# Patient Record
Sex: Male | Born: 1995 | Hispanic: No | Marital: Single | Smoking: Never smoker
Health system: Southern US, Community
[De-identification: ages and names within clinical notes are randomized; demographics above are authoritative.]

## PROBLEM LIST (undated history)

## (undated) HISTORY — PX: OTHER SURGICAL HISTORY: SHX169

## (undated) HISTORY — PX: CIRCUMCISION: SUR203

## (undated) HISTORY — PX: MANDIBLE FRACTURE SURGERY: SHX706

---
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
## 2019-03-22 ENCOUNTER — Emergency Department (HOSPITAL_BASED_OUTPATIENT_CLINIC_OR_DEPARTMENT_OTHER): Payer: No Typology Code available for payment source

## 2019-03-22 ENCOUNTER — Emergency Department (HOSPITAL_BASED_OUTPATIENT_CLINIC_OR_DEPARTMENT_OTHER)
Admission: EM | Admit: 2019-03-22 | Discharge: 2019-03-22 | Disposition: A | Discharge status: Home / Self Care | Payer: No Typology Code available for payment source | Attending: Emergency Medicine | Admitting: Emergency Medicine

## 2019-03-22 ENCOUNTER — Encounter (HOSPITAL_BASED_OUTPATIENT_CLINIC_OR_DEPARTMENT_OTHER): Payer: Self-pay | Admitting: Emergency Medicine

## 2019-03-22 ENCOUNTER — Other Ambulatory Visit: Payer: Self-pay

## 2019-03-22 DIAGNOSIS — S40811A Abrasion of right upper arm, initial encounter: Secondary | ICD-10-CM | POA: Diagnosis not present

## 2019-03-22 DIAGNOSIS — Y9372 Activity, wrestling: Secondary | ICD-10-CM | POA: Diagnosis not present

## 2019-03-22 DIAGNOSIS — Y999 Unspecified external cause status: Secondary | ICD-10-CM | POA: Diagnosis not present

## 2019-03-22 DIAGNOSIS — S20319A Abrasion of unspecified front wall of thorax, initial encounter: Secondary | ICD-10-CM | POA: Insufficient documentation

## 2019-03-22 DIAGNOSIS — Y92009 Unspecified place in unspecified non-institutional (private) residence as the place of occurrence of the external cause: Secondary | ICD-10-CM | POA: Diagnosis not present

## 2019-03-22 DIAGNOSIS — S43014A Anterior dislocation of right humerus, initial encounter: Secondary | ICD-10-CM | POA: Insufficient documentation

## 2019-03-22 DIAGNOSIS — W010XXA Fall on same level from slipping, tripping and stumbling without subsequent striking against object, initial encounter: Secondary | ICD-10-CM | POA: Insufficient documentation

## 2019-03-22 DIAGNOSIS — S0081XA Abrasion of other part of head, initial encounter: Secondary | ICD-10-CM | POA: Insufficient documentation

## 2019-03-22 DIAGNOSIS — S4991XA Unspecified injury of right shoulder and upper arm, initial encounter: Secondary | ICD-10-CM | POA: Diagnosis present

## 2019-03-22 DIAGNOSIS — Z23 Encounter for immunization: Secondary | ICD-10-CM | POA: Diagnosis not present

## 2019-03-22 MED ORDER — METHOCARBAMOL 500 MG PO TABS: 500.0000 mg | ORAL_TABLET | Freq: Once | ORAL | Status: DC

## 2019-03-22 MED ORDER — FENTANYL CITRATE (PF) 100 MCG/2ML IJ SOLN
50.0000 ug | Freq: Once | INTRAMUSCULAR | Status: AC
  Administered 2019-03-22: 50 ug via INTRAVENOUS
  Filled 2019-03-22: qty 2

## 2019-03-22 MED ORDER — PROPOFOL 10 MG/ML IV BOLUS
1.0000 mg/kg | INTRAVENOUS | Status: DC | PRN
  Filled 2019-03-22: qty 20

## 2019-03-22 MED ORDER — CLONAZEPAM 0.5 MG PO TBDP
0.5000 mg | ORAL_TABLET | Freq: Once | ORAL | Status: DC
  Filled 2019-03-22: qty 1

## 2019-03-22 MED ORDER — NAPROXEN 250 MG PO TABS: 500.0000 mg | ORAL_TABLET | Freq: Once | ORAL | Status: DC

## 2019-03-22 MED ORDER — TETANUS-DIPHTH-ACELL PERTUSSIS 5-2.5-18.5 LF-MCG/0.5 IM SUSP
0.5000 mL | Freq: Once | INTRAMUSCULAR | Status: AC
  Administered 2019-03-22: 0.5 mL via INTRAMUSCULAR
  Filled 2019-03-22: qty 0.5

## 2019-03-22 MED ORDER — ONDANSETRON 4 MG PO TBDP: 4.0000 mg | ORAL_TABLET | Freq: Once | ORAL | Status: DC

## 2019-03-22 MED ORDER — PROPOFOL 10 MG/ML IV BOLUS
INTRAVENOUS | Status: AC | PRN
  Administered 2019-03-22 (×2): 35.95 mg via INTRAVENOUS

## 2019-03-22 MED ORDER — PROPOFOL 1000 MG/100ML IV EMUL: INTRAVENOUS | Status: AC | PRN

## 2019-03-22 NOTE — Discharge Instructions (Addendum)
You have been diagnosed today with Anterior Right Shoulder Dislocation.  At this time there does not appear to be the presence of an emergent medical condition, however there is always the potential for conditions to change. Please read and follow the below instructions.  Please return to the Emergency Department immediately for any new or worsening symptoms or if your symptoms reoccur. Please be sure to follow up with your Primary Care Provider within one week regarding your visit today; please call their office to schedule an appointment even if you are feeling better for a follow-up visit. Please continue using the sling provided today to protect your shoulder from reinjury.  Drink plenty of water and get plenty of rest.  You may use over-the-counter anti-inflammatory such as Tylenol and ibuprofen to help with your pain.  You may use rest, ice to help with pain as well.  Get help right away if: Your pain gets worse, not better. You lose feeling in your arm or hand. Your arm or hand turns white and cold. You have any new/concerning or worsening of symptoms  Please read the additional information packets attached to your discharge summary.  Do not take your medicine if  develop an itchy rash, swelling in your mouth or lips, or difficulty breathing; call 911 and seek immediate emergency medical attention if this occurs.  Note: Portions of this text may have been transcribed using voice recognition software. Every effort was made to ensure accuracy; however, inadvertent computerized transcription errors may still be present.

## 2019-03-22 NOTE — ED Provider Notes (Signed)
[facility] EMERGENCY DEPARTMENT Provider Note   CSN: [id number] Arrival date & time: 03/22/19  1007     History Chief Complaint  Patient presents with  . Shoulder Injury    Adam Patrick is a 24 y.o. male otherwise healthy no daily medication use.  Patient reports that he was wrestling with his brother this morning when he fell onto his right shoulder, he reports immediate onset sharp aching pain nonradiating worsened with any movement or palpation of the right shoulder, minimally improved with staying still.  He reports his shoulder is dislocated.  He denies any other injuries or concerns today, denies head injury, loss of consciousness, blood thinner use, neck pain, back pain, chest pain, abdominal pain or pain to his other 3 extremities. HPI     History reviewed. No pertinent past medical history.  Patient Active Problem List   Diagnosis Date Noted  . Sports physical 03/16/2011    Past Surgical History:  Procedure Laterality Date  . Broken jaw    . CIRCUMCISION    . MANDIBLE FRACTURE SURGERY         Family History  Problem Relation Age of Onset  . Sudden death Neg Hx   . Heart attack Neg Hx     Social History   Tobacco Use  . Smoking status: Never Smoker  . Smokeless tobacco: Never Used  Substance Use Topics  . Alcohol use: Yes    Comment: occ  . Drug use: Yes    Types: Marijuana    Comment: occ    Home Medications Prior to Admission medications   Not on File    Allergies    Patient has no known allergies.  Review of Systems   Review of Systems Ten systems are reviewed and are negative for acute change except as noted in the HPI  Physical Exam Updated Vital Signs BP 126/86   Pulse 64 Comment: ra  for pulse ox  Temp 98.5 F (36.9 C) (Oral)   Resp 16 Comment: ra  for pulse ox  Ht 5\' 7"  (1.702 m)   Wt 71.9 kg   SpO2 100% Comment: ra  for pulse ox  BMI 24.84 kg/m   Physical Exam Constitutional:      General: He is not in  acute distress.    Appearance: Normal appearance. He is well-developed. He is not ill-appearing or diaphoretic.  HENT:     Head: Normocephalic. Abrasion present. No raccoon eyes or Battle's sign.     Jaw: There is normal jaw occlusion. No trismus.      Comments: Patient with superficial rugburn rash to the right side of his face, nontender, consistent with wrestling.  Orbits and midface stable without pain.  No intraoral injury.    Right Ear: Tympanic membrane and external ear normal. No hemotympanum.     Left Ear: Tympanic membrane and external ear normal. No hemotympanum.     Nose: Nose normal. No nasal tenderness.     Right Nostril: No epistaxis.     Left Nostril: No epistaxis.     Mouth/Throat:     Mouth: Mucous membranes are moist.     Pharynx: Oropharynx is clear.  Eyes:     General: Vision grossly intact. Gaze aligned appropriately.     Extraocular Movements: Extraocular movements intact.     Conjunctiva/sclera: Conjunctivae normal.     Pupils: Pupils are equal, round, and reactive to light.  Neck:     Trachea: Trachea and phonation normal. No tracheal deviation.  Cardiovascular:     Rate and Rhythm: Normal rate and regular rhythm.     Pulses:          Radial pulses are 2+ on the right side and 2+ on the left side.  Pulmonary:     Effort: Pulmonary effort is normal. No respiratory distress.  Chest:     Chest wall: No deformity, tenderness or crepitus.  Abdominal:     General: There is no distension.     Palpations: Abdomen is soft.     Tenderness: There is no abdominal tenderness. There is no guarding or rebound.  Musculoskeletal:     Right shoulder: Deformity and tenderness present. Decreased range of motion.     Left shoulder: Normal.     Right upper arm: Normal.     Left upper arm: Normal.     Right elbow: Normal.     Left elbow: Normal.     Right forearm: Normal.     Left forearm: Normal.     Right wrist: Normal.     Left wrist: Normal.     Right hand: Normal.       Left hand: Normal.     Cervical back: Full passive range of motion without pain, normal range of motion and neck supple. No spinous process tenderness or muscular tenderness.     Comments: No midline C/T/L spinal tenderness to palpation, no paraspinal muscle tenderness, no deformity, crepitus, or step-off noted. No sign of injury to the neck or back.  Skin:    General: Skin is warm and dry.     Capillary Refill: Capillary refill takes less than 2 seconds.     Comments: Multiple superficial rashes to patient's face, chest, arms consistent with rugburn from wrestling.  Neurological:     Mental Status: He is alert.     GCS: GCS eye subscore is 4. GCS verbal subscore is 5. GCS motor subscore is 6.     Comments: Speech is clear and goal oriented, follows commands Major Cranial nerves without deficit, no facial droop Moves extremities without ataxia, coordination intact  Psychiatric:        Behavior: Behavior normal.     ED Results / Procedures / Treatments   Labs (all labs ordered are listed, but only abnormal results are displayed) Labs Reviewed - No data to display  EKG None  Radiology DG Shoulder Right  Result Date: 03/22/2019 CLINICAL DATA:  Patient wrestling with his brother. Right arm was over extended. Patient cannot left or move right arm away from his body. EXAM: RIGHT SHOULDER - 2+ VIEW COMPARISON:  None. FINDINGS: The right shoulder is dislocated with the humeral head lying along the anterior margin of the glenoid. No fracture. AC joint normally spaced and aligned. IMPRESSION: 1. Anterior dislocation of the right shoulder.  No fracture. Electronically Signed   By: Amie Portland M.D.   On: 03/22/2019 11:26   DG Shoulder Right Portable  Result Date: 03/22/2019 CLINICAL DATA:  Post reduction EXAM: PORTABLE RIGHT SHOULDER COMPARISON:  Earlier same day FINDINGS: Right humeral head appears in anatomic location on this view. No new abnormality. IMPRESSION: Post reduction of right  shoulder dislocation. Electronically Signed   By: Guadlupe Spanish M.D.   On: 03/22/2019 13:01    Procedures .Ortho Injury Treatment  Date/Time: 03/22/2019 1:41 PM Performed by: Bill Salinas, PA-C Authorized by: Bill Salinas, PA-C   Consent:    Consent obtained:  Verbal   Consent given by:  Patient  Risks discussed:  Fracture, irreducible dislocation, recurrent dislocation, nerve damage, restricted joint movement, stiffness and vascular damageInjury location: shoulder Location details: right shoulder Injury type: dislocation Dislocation type: anterior Chronicity: new Pre-procedure neurovascular assessment: neurovascularly intact Pre-procedure distal perfusion: normal Pre-procedure neurological function: normal Pre-procedure range of motion: reduced  Anesthesia: Local anesthesia used: no  Patient sedated: Yes. Refer to sedation procedure documentation for details of sedation. Manipulation performed: yes Reduction method: external rotation and traction and counter traction (Assisted by Dr. Lynelle Doctor) Reduction successful: yes X-ray confirmed reduction: yes Immobilization: sling Post-procedure neurovascular assessment: post-procedure neurovascularly intact Post-procedure distal perfusion: normal Post-procedure neurological function: normal Post-procedure range of motion comment: Immobilized Patient tolerance: patient tolerated the procedure well with no immediate complications Comments: Please see separate sedation note by Dr. Lynelle Doctor    (including critical care time)  Medications Ordered in ED Medications  propofol (DIPRIVAN) 10 mg/mL bolus/IV push 71.9 mg (has no administration in time range)  fentaNYL (SUBLIMAZE) injection 50 mcg (50 mcg Intravenous Given 03/22/19 1108)  fentaNYL (SUBLIMAZE) injection 50 mcg (50 mcg Intravenous Given 03/22/19 1142)  propofol (DIPRIVAN) 1000 MG/100ML infusion (5,169.61 mcg Intravenous New Bag/Given 03/22/19 1210)  propofol (DIPRIVAN)  10 mg/mL bolus/IV push (35.95 mg Intravenous Given 03/22/19 1217)  Tdap (BOOSTRIX) injection 0.5 mL (0.5 mLs Intramuscular Given 03/22/19 1346)    ED Course  I have reviewed the triage vital signs and the nursing notes.  Pertinent labs & imaging results that were available during my care of the patient were reviewed by me and considered in my medical decision making (see chart for details).    MDM Rules/Calculators/A&P                     24 year old male presented today with a right shoulder dislocation after wrestling match with his brother this morning.  Obvious anterior right shoulder dislocation on arrival pain improved with fentanyl.  No sign of significant head injury, no neck pain, chest pain abdominal pain or pain to the other 3 extremities.  He is well-appearing and in good spirits.  Neurovascular intact to the right upper extremity.  Right shoulder x-ray ordered, no indication for any additional imaging or work-up at this time.  He has small rugburn type rashes to right face without deformity or pain consistent with history of wrestling, no red flags requiring imaging of the face head or neck.  Additionally small abrasion of the right arm as well, no wounds requiring repair today.  Will update Tdap as patient does not recall his last one. - DG right shoulder:  IMPRESSION:  1. Anterior dislocation of the right shoulder. No fracture.   I have personally reviewed the x-ray and agree with radiologist interpretation. - Fares technique was attempted with patient consent unfortunately was unsuccessful, patient tolerated attempt well.  Patient then consented to conscious sedation and traction/external rotation technique.  Procedure performed as above, sedation performed by Dr. Lynelle Doctor.  Postreduction x-ray personally reviewed shows reduction and I agree with radiologist interpretation.  Patient was then placed in a sling, neurovascular intact post procedure reports improvement of pain.  He has no  questions or concerns.  He is ambulating around the emergency department without assistance or difficulty.  He is requesting discharge.  Plan to discharge with orthopedic referral, rice therapy and OTC anti-inflammatories.  Discussed with Dr. Dion Saucier, advises the patient may follow-up in his office this coming Monday for reevaluation.  At this time there does not appear to be any evidence of an acute emergency  medical condition and the patient appears stable for discharge with appropriate outpatient follow up. Diagnosis was discussed with patient who verbalizes understanding of care plan and is agreeable to discharge. I have discussed return precautions with patient who verbalizes understanding of return precautions. Patient encouraged to follow-up with their PCP and ortho. All questions answered.  Patient's case discussed with Dr. Lynelle Doctor who agrees with discharge at this time.  Note: Portions of this report may have been transcribed using voice recognition software. Every effort was made to ensure accuracy; however, inadvertent computerized transcription errors may still be present. Final Clinical Impression(s) / ED Diagnoses Final diagnoses:  Anterior dislocation of right shoulder, initial encounter    Rx / DC Orders ED Discharge Orders    None       Elizabeth Palau 03/22/19 1454    Linwood Dibbles, MD 03/24/19 [phone]

## 2019-03-22 NOTE — ED Provider Notes (Signed)
Patient presented to the ED for shoulder dislocation.  Patient denies any prior complications associated with anesthesia.  He denies any significant medical problems. Physical Exam  BP 130/81   Pulse 72   Temp 98.5 F (36.9 C) (Oral)   Resp 17   Ht 1.702 m (5\' 7" )   Wt 71.9 kg   SpO2 98%   BMI 24.84 kg/m   Physical Exam Vitals and nursing note reviewed.  Constitutional:      General: He is not in acute distress.    Appearance: He is well-developed.  HENT:     Head: Normocephalic and atraumatic.     Right Ear: External ear normal.     Left Ear: External ear normal.     Mouth/Throat:     Mouth: Mucous membranes are moist.     Pharynx: No oropharyngeal exudate or posterior oropharyngeal erythema.  Eyes:     General: No scleral icterus.       Right eye: No discharge.        Left eye: No discharge.     Conjunctiva/sclera: Conjunctivae normal.  Neck:     Trachea: No tracheal deviation.  Cardiovascular:     Rate and Rhythm: Normal rate.  Pulmonary:     Effort: Pulmonary effort is normal. No respiratory distress.     Breath sounds: No stridor.  Abdominal:     General: There is no distension.  Musculoskeletal:        General: No swelling or deformity.     Cervical back: Neck supple.     Comments: Right shoulder dislocation  Skin:    General: Skin is warm and dry.     Findings: No rash.  Neurological:     Mental Status: He is alert.     Cranial Nerves: Cranial nerve deficit: no gross deficits.     ED Course/Procedures     .Sedation  Date/Time: 03/22/2019 12:27 PM Performed by: Dorie Rank, MD Authorized by: Dorie Rank, MD   Consent:    Consent obtained:  Verbal and written   Consent given by:  Patient   Risks discussed:  Allergic reaction, dysrhythmia, inadequate sedation, nausea, prolonged hypoxia resulting in organ damage, prolonged sedation necessitating reversal, respiratory compromise necessitating ventilatory assistance and intubation and vomiting  Alternatives discussed:  Analgesia without sedation, anxiolysis and regional anesthesia Universal protocol:    Procedure explained and questions answered to patient or proxy's satisfaction: yes     Relevant documents present and verified: yes     Test results available and properly labeled: yes     Imaging studies available: yes     Immediately prior to procedure a time out was called: yes     Patient identity confirmation method:  Verbally with patient Indications:    Procedure necessitating sedation performed by:  Physician performing sedation Pre-sedation assessment:    Time since last food or drink:  12   ASA classification: class 1 - normal, healthy patient     Neck mobility: normal     Mouth opening:  3 or more finger widths   Thyromental distance:  4 finger widths   Mallampati score:  I - soft palate, uvula, fauces, pillars visible   Pre-sedation assessments completed and reviewed: airway patency, cardiovascular function, hydration status, mental status, nausea/vomiting, pain level, respiratory function and temperature     Pre-sedation assessment completed:  03/22/2019 11:58 AM Immediate pre-procedure details:    Reassessment: Patient reassessed immediately prior to procedure     Reviewed: vital signs, relevant  labs/tests and NPO status     Verified: bag valve mask available, emergency equipment available, intubation equipment available, IV patency confirmed, oxygen available and suction available   Procedure details (see MAR for exact dosages):    Preoxygenation:  Nasal cannula   Sedation:  Propofol   Intended level of sedation: deep   Intra-procedure monitoring:  Blood pressure monitoring, cardiac monitor, continuous pulse oximetry, frequent LOC assessments, frequent vital sign checks and continuous capnometry   Intra-procedure events: none     Total Provider sedation time (minutes):  30 Post-procedure details:    Post-sedation assessment completed:  03/22/2019 12:39 PM    Attendance: Constant attendance by certified staff until patient recovered     Recovery: Patient returned to pre-procedure baseline     Post-sedation assessments completed and reviewed: airway patency, cardiovascular function, hydration status, mental status, nausea/vomiting, pain level, respiratory function and temperature     Patient is stable for discharge or admission: yes     Patient tolerance:  Tolerated well, no immediate complications    MDM  Pt tolerated procedure.  Required repeat doses.  Was speaking during most of the procedure.  I assisted directly in reduction.   Wil xray to confirm       Linwood Dibbles, MD 03/22/19 1231

## 2019-03-22 NOTE — ED Triage Notes (Signed)
Fell onto right shoulder in the past hour.  Obvious deformity. Skin is intact.

## 2020-07-28 IMAGING — CR DG SHOULDER 2+V*R*
3 series · 3 of 3 positions shown · non-contrast
Comparison: None.

CLINICAL DATA: Patient wrestling with his brother. Right arm was
over extended. Patient cannot left or move right arm away from his
body.

EXAM:
RIGHT SHOULDER - 2+ VIEW

[w shoulder grashey right]
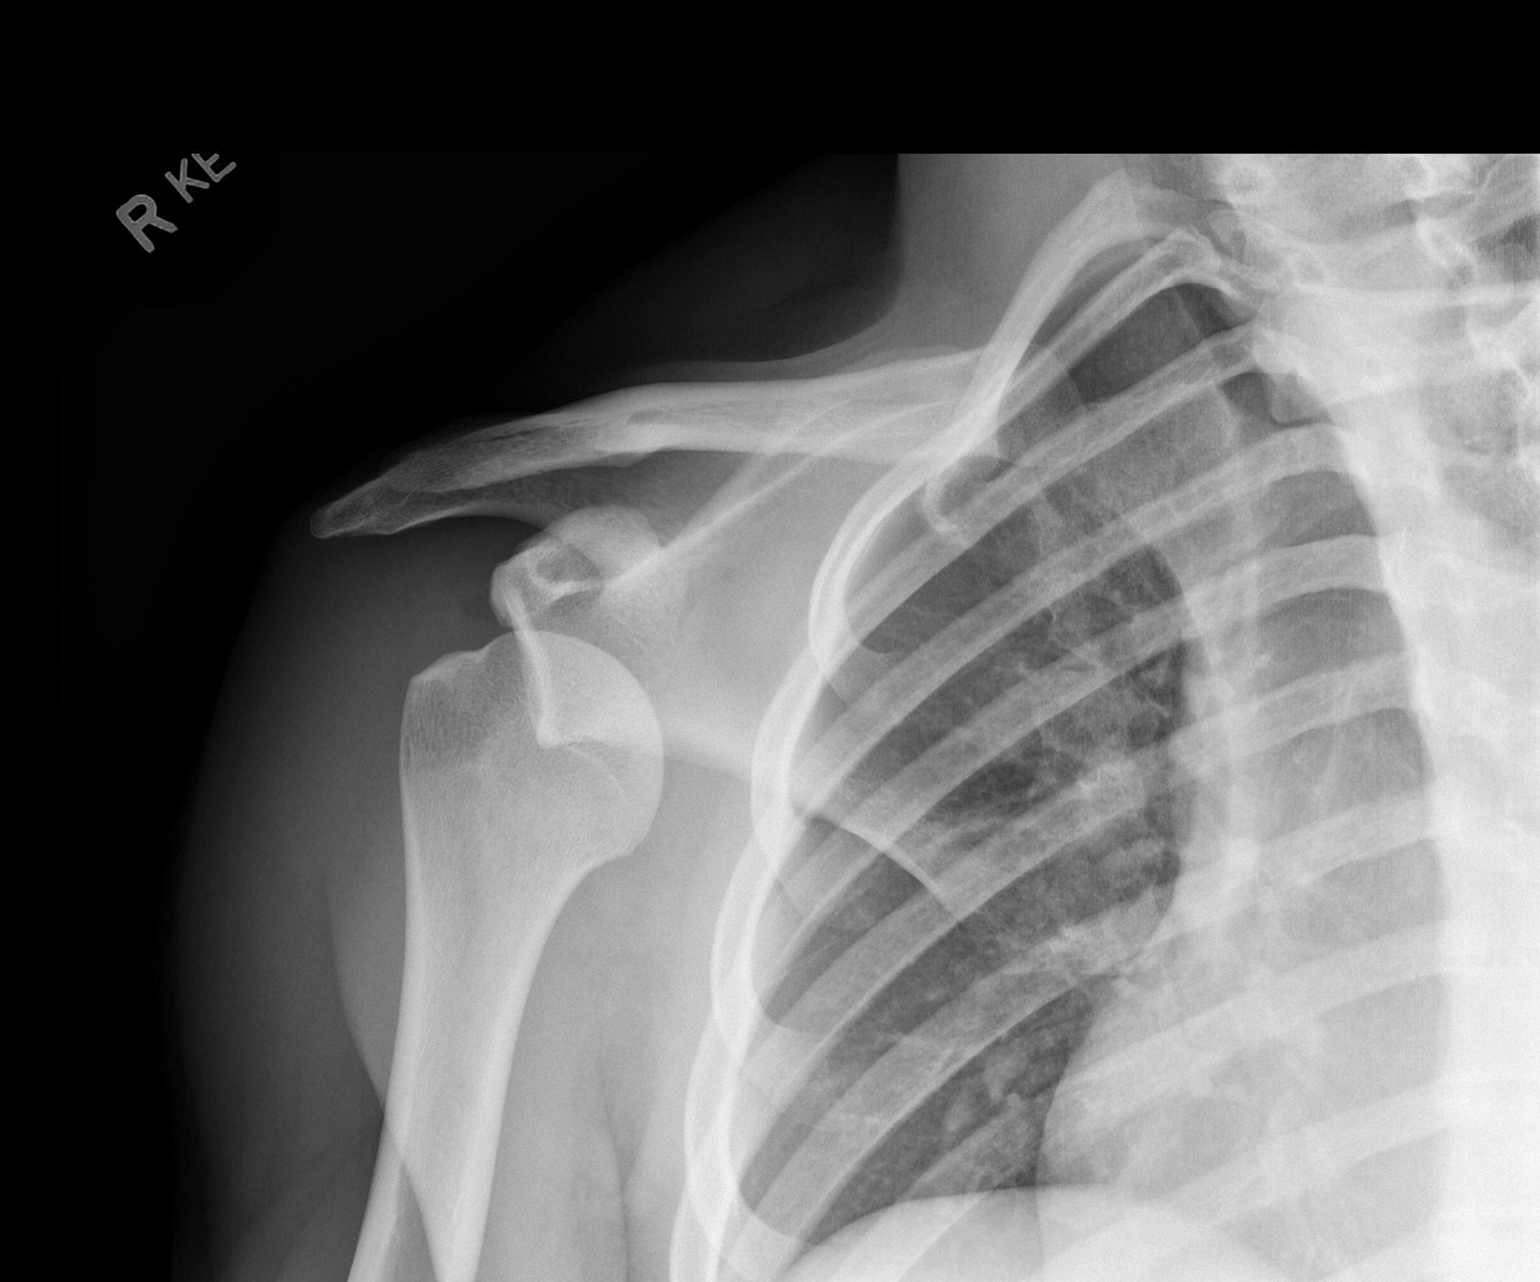

[w shoulder ap internal righ]
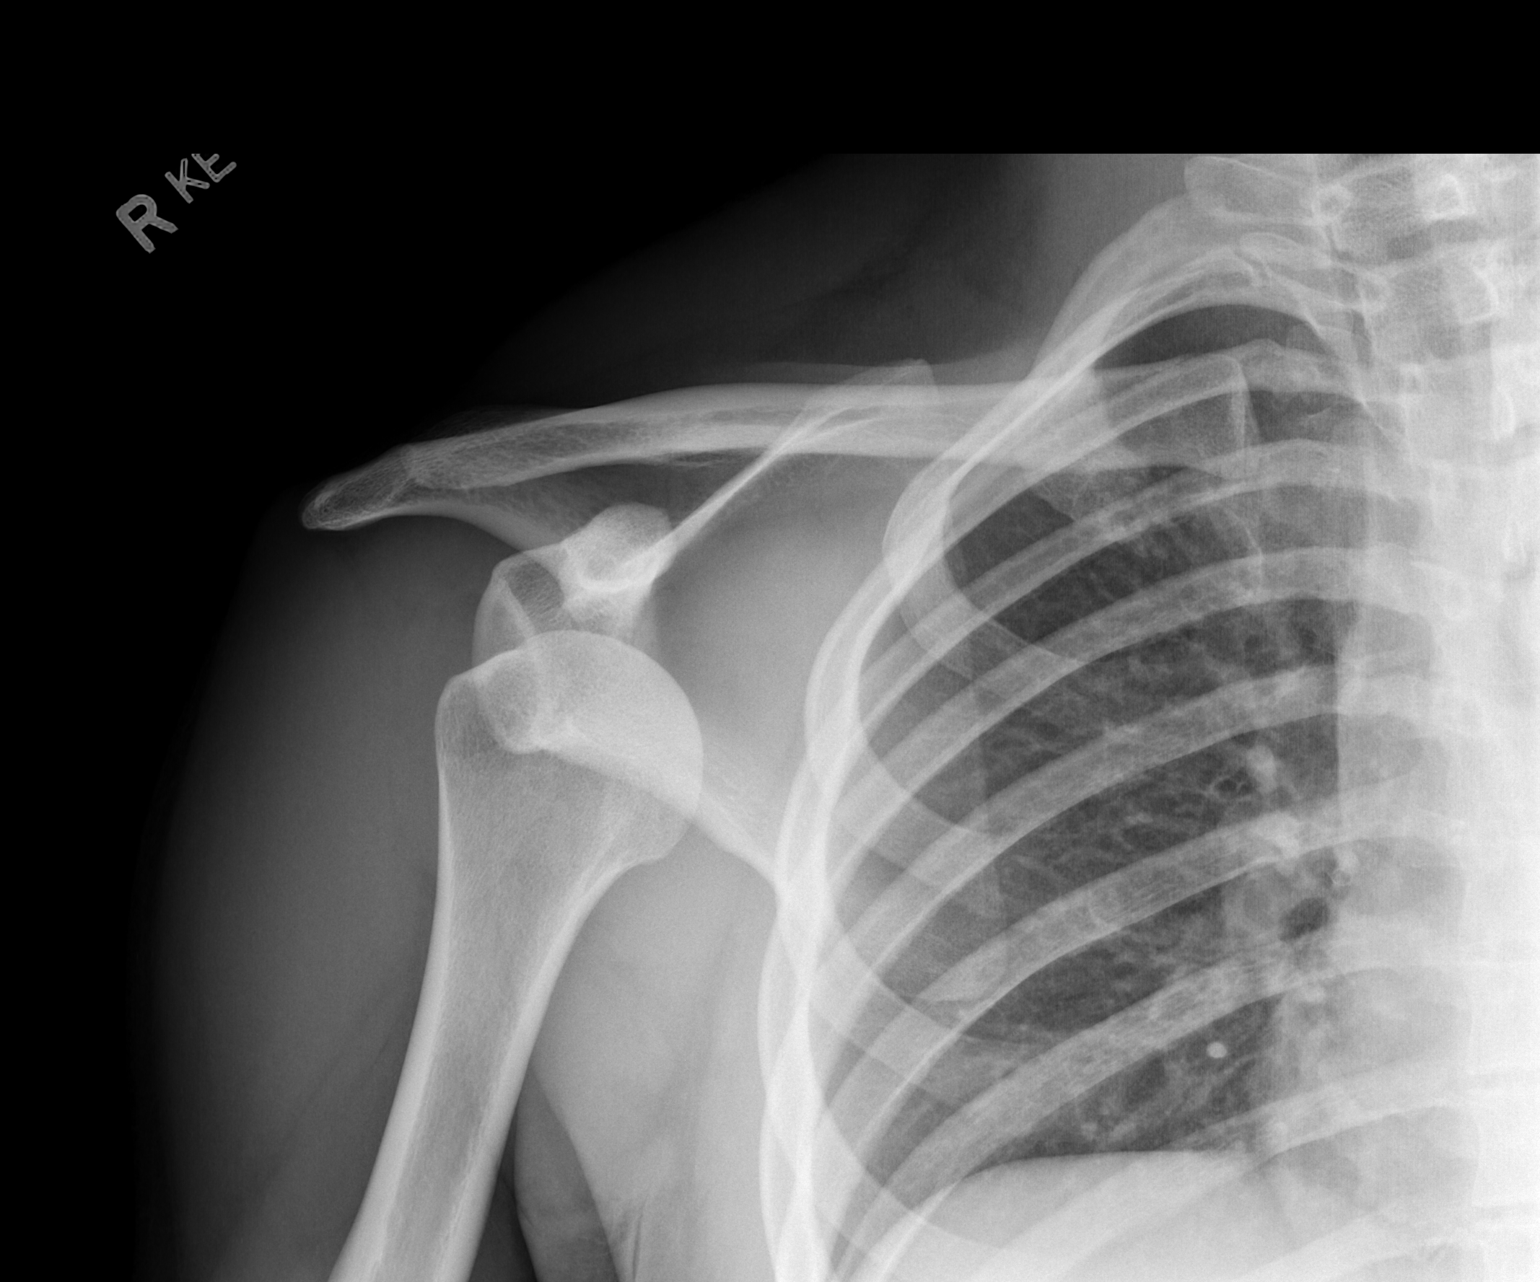

[w shoulder y view right]
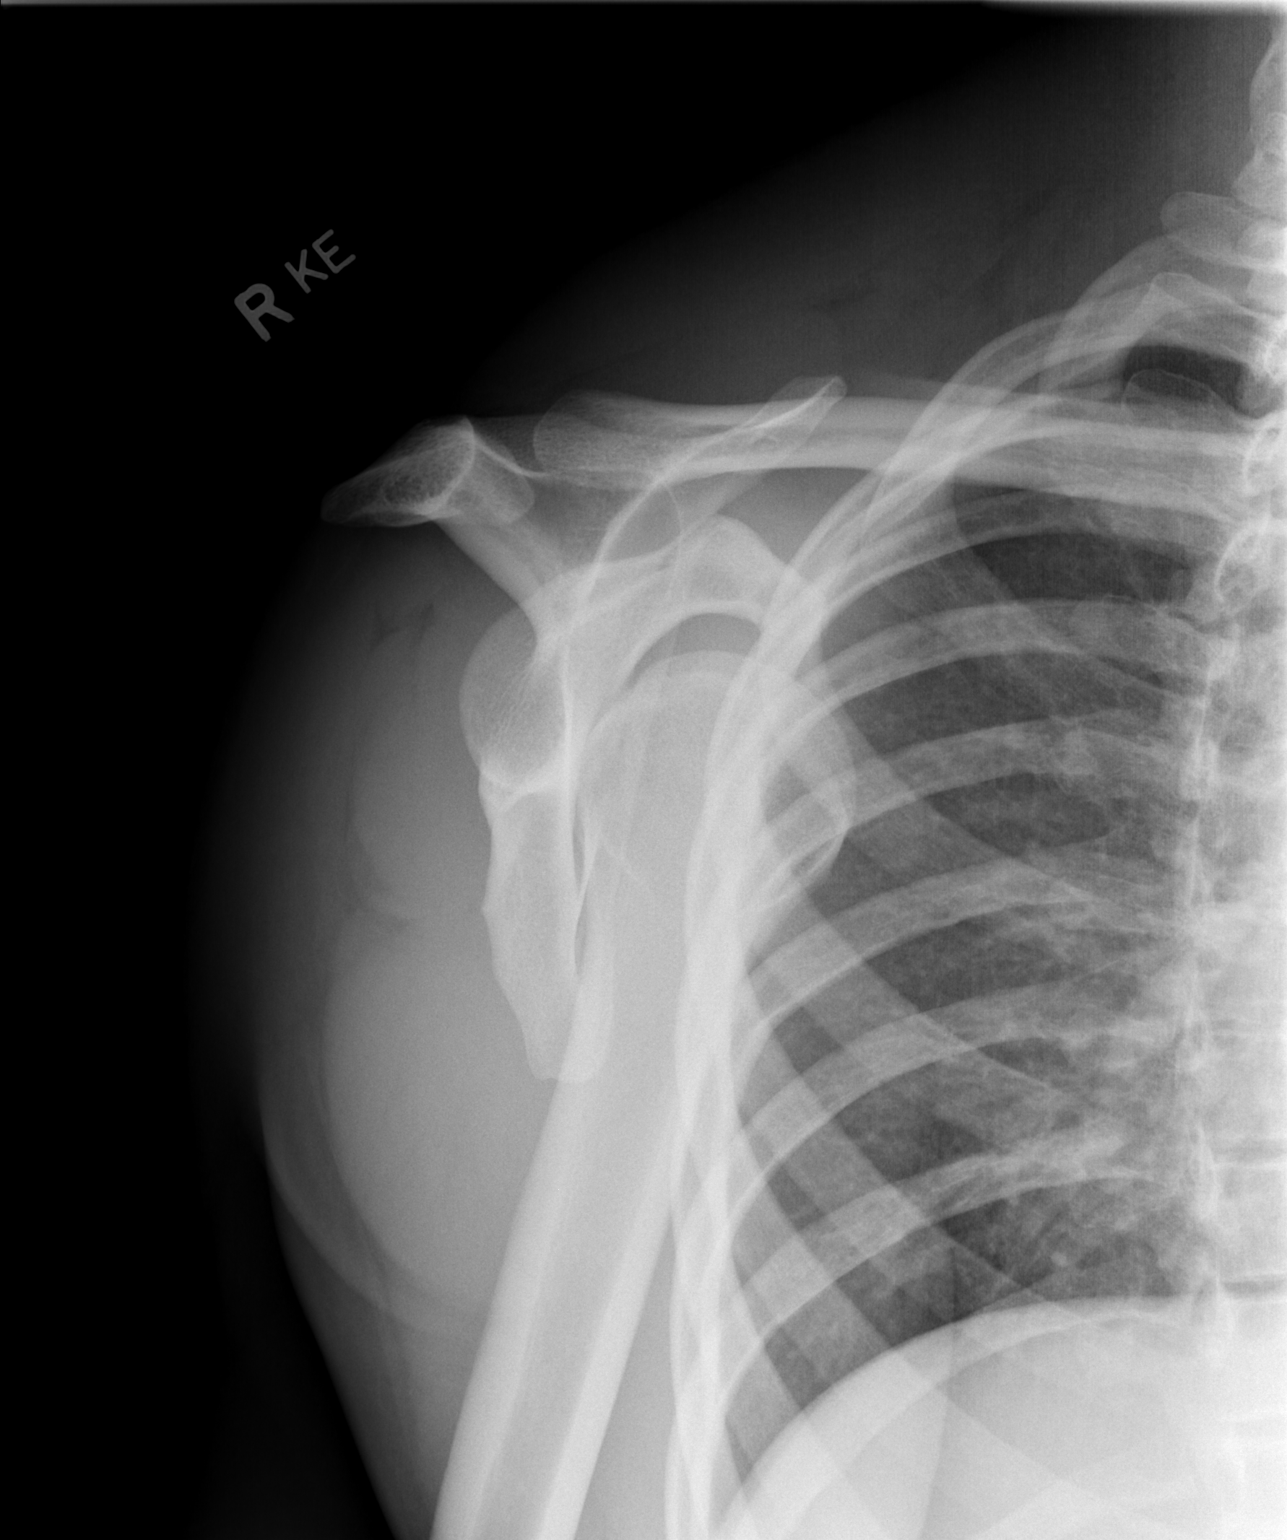

[3 of 3 positions shown; findings below may reference images not displayed]

FINDINGS: The right shoulder is dislocated with the humeral head lying along
the anterior margin of the glenoid. No fracture.

AC joint normally spaced and aligned.
IMPRESSION: 1. Anterior dislocation of the right shoulder.  No fracture.
